# Patient Record
Sex: Male | Born: 1980
Health system: Southern US, Community
[De-identification: ages and names within clinical notes are randomized; demographics above are authoritative.]

## PROBLEM LIST (undated history)

## (undated) DIAGNOSIS — Z9289 Personal history of other medical treatment: Secondary | ICD-10-CM

---

## 2000-01-23 ENCOUNTER — Inpatient Hospital Stay (HOSPITAL_COMMUNITY): Admission: AD | Admit: 2000-01-23 | Discharge: 2000-01-27 | Payer: Self-pay | Admitting: *Deleted

## 2001-03-09 ENCOUNTER — Encounter: Admission: RE | Admit: 2001-03-09 | Discharge: 2001-03-09 | Payer: Self-pay | Admitting: *Deleted

## 2003-07-12 ENCOUNTER — Emergency Department (HOSPITAL_COMMUNITY): Admission: EM | Admit: 2003-07-12 | Discharge: 2003-07-12 | Payer: Self-pay | Admitting: Emergency Medicine

## 2018-05-30 DIAGNOSIS — Z0000 Encounter for general adult medical examination without abnormal findings: Secondary | ICD-10-CM | POA: Diagnosis not present

## 2018-06-06 DIAGNOSIS — M25569 Pain in unspecified knee: Secondary | ICD-10-CM | POA: Diagnosis not present

## 2018-06-06 DIAGNOSIS — Z0000 Encounter for general adult medical examination without abnormal findings: Secondary | ICD-10-CM | POA: Diagnosis not present

## 2018-08-02 DIAGNOSIS — M25531 Pain in right wrist: Secondary | ICD-10-CM | POA: Diagnosis not present

## 2018-08-02 DIAGNOSIS — G5601 Carpal tunnel syndrome, right upper limb: Secondary | ICD-10-CM | POA: Diagnosis not present

## 2018-08-16 DIAGNOSIS — M25531 Pain in right wrist: Secondary | ICD-10-CM | POA: Diagnosis not present

## 2018-08-16 DIAGNOSIS — G5601 Carpal tunnel syndrome, right upper limb: Secondary | ICD-10-CM | POA: Diagnosis not present

## 2018-09-03 DIAGNOSIS — M25531 Pain in right wrist: Secondary | ICD-10-CM | POA: Diagnosis not present

## 2018-09-04 ENCOUNTER — Other Ambulatory Visit: Payer: Self-pay | Admitting: Internal Medicine

## 2018-09-04 DIAGNOSIS — M79641 Pain in right hand: Secondary | ICD-10-CM

## 2018-09-04 DIAGNOSIS — M25531 Pain in right wrist: Secondary | ICD-10-CM

## 2018-10-11 DIAGNOSIS — M25531 Pain in right wrist: Secondary | ICD-10-CM | POA: Diagnosis not present

## 2018-10-11 DIAGNOSIS — S63591A Other specified sprain of right wrist, initial encounter: Secondary | ICD-10-CM | POA: Diagnosis not present

## 2019-01-02 DIAGNOSIS — Z20828 Contact with and (suspected) exposure to other viral communicable diseases: Secondary | ICD-10-CM | POA: Diagnosis not present

## 2019-02-14 DIAGNOSIS — F4322 Adjustment disorder with anxiety: Secondary | ICD-10-CM | POA: Diagnosis not present

## 2019-02-21 DIAGNOSIS — F4325 Adjustment disorder with mixed disturbance of emotions and conduct: Secondary | ICD-10-CM | POA: Diagnosis not present

## 2019-03-07 DIAGNOSIS — F4325 Adjustment disorder with mixed disturbance of emotions and conduct: Secondary | ICD-10-CM | POA: Diagnosis not present

## 2019-03-12 DIAGNOSIS — F4322 Adjustment disorder with anxiety: Secondary | ICD-10-CM | POA: Diagnosis not present

## 2019-03-21 DIAGNOSIS — F4322 Adjustment disorder with anxiety: Secondary | ICD-10-CM | POA: Diagnosis not present

## 2019-03-23 ENCOUNTER — Other Ambulatory Visit: Payer: Self-pay

## 2019-03-23 DIAGNOSIS — Z20822 Contact with and (suspected) exposure to covid-19: Secondary | ICD-10-CM

## 2019-03-25 ENCOUNTER — Telehealth: Payer: Self-pay

## 2019-03-25 LAB — NOVEL CORONAVIRUS, NAA: SARS-CoV-2, NAA: NOT DETECTED

## 2019-03-25 NOTE — Telephone Encounter (Signed)
Patient advise that result are not in yet

## 2019-04-02 DIAGNOSIS — F4322 Adjustment disorder with anxiety: Secondary | ICD-10-CM | POA: Diagnosis not present

## 2019-04-16 DIAGNOSIS — F4322 Adjustment disorder with anxiety: Secondary | ICD-10-CM | POA: Diagnosis not present

## 2019-07-04 ENCOUNTER — Ambulatory Visit: Payer: Self-pay | Attending: Internal Medicine

## 2019-07-04 DIAGNOSIS — Z23 Encounter for immunization: Secondary | ICD-10-CM | POA: Insufficient documentation

## 2019-07-04 NOTE — Progress Notes (Signed)
   Covid-19 Vaccination Clinic  Name:  OSMANY AZER    MRN: [id number] DOB: 1981/04/11  07/04/2019  Mr. Straka was observed post Covid-19 immunization for 15 minutes without incident. He was provided with Vaccine Information Sheet and instruction to access the V-Safe system.   Mr. Yannuzzi was instructed to call 911 with any severe reactions post vaccine: Marland Kitchen Difficulty breathing  . Swelling of face and throat  . A fast heartbeat  . A bad rash all over body  . Dizziness and weakness   Immunizations Administered    Name Date Dose VIS Date Route   Pfizer COVID-19 Vaccine 07/04/2019  5:55 PM 0.3 mL 04/12/2019 Intramuscular   Manufacturer: [employer], [employer]   Lot: [id number]   NDC: [address]-1

## 2019-07-31 ENCOUNTER — Ambulatory Visit: Payer: Self-pay | Attending: Internal Medicine

## 2019-07-31 DIAGNOSIS — Z23 Encounter for immunization: Secondary | ICD-10-CM

## 2019-07-31 NOTE — Progress Notes (Signed)
   Covid-19 Vaccination Clinic  Name:  ARJAY JASKIEWICZ    MRN: [id number] DOB: Oct 23, 1980  07/31/2019  Mr. Tanguma was observed post Covid-19 immunization for 15 minutes without incident. He was provided with Vaccine Information Sheet and instruction to access the V-Safe system.   Mr. Edelson was instructed to call 911 with any severe reactions post vaccine: Marland Kitchen Difficulty breathing  . Swelling of face and throat  . A fast heartbeat  . A bad rash all over body  . Dizziness and weakness   Immunizations Administered    Name Date Dose VIS Date Route   Pfizer COVID-19 Vaccine 07/31/2019  9:11 AM 0.3 mL 04/12/2019 Intramuscular   Manufacturer: [employer], [employer]   Lot: [id number]   NDC: [address]-5

## 2019-08-19 ENCOUNTER — Ambulatory Visit: Payer: 59 | Attending: Internal Medicine

## 2019-08-19 DIAGNOSIS — Z20822 Contact with and (suspected) exposure to covid-19: Secondary | ICD-10-CM

## 2019-08-20 LAB — SARS-COV-2, NAA 2 DAY TAT

## 2019-08-20 LAB — NOVEL CORONAVIRUS, NAA: SARS-CoV-2, NAA: NOT DETECTED

## 2020-02-14 ENCOUNTER — Other Ambulatory Visit: Payer: Self-pay | Admitting: Internal Medicine

## 2020-02-20 ENCOUNTER — Other Ambulatory Visit: Payer: Self-pay | Admitting: Internal Medicine

## 2020-02-20 DIAGNOSIS — K4090 Unilateral inguinal hernia, without obstruction or gangrene, not specified as recurrent: Secondary | ICD-10-CM

## 2020-03-04 ENCOUNTER — Ambulatory Visit
Admission: RE | Admit: 2020-03-04 | Discharge: 2020-03-04 | Disposition: A | Discharge status: Home / Self Care | Payer: 59 | Source: Ambulatory Visit | Attending: Internal Medicine | Admitting: Internal Medicine

## 2020-03-04 DIAGNOSIS — K4090 Unilateral inguinal hernia, without obstruction or gangrene, not specified as recurrent: Secondary | ICD-10-CM

## 2020-03-04 MED ORDER — IOPAMIDOL (ISOVUE-300) INJECTION 61%
100.0000 mL | Freq: Once | INTRAVENOUS | Status: AC | PRN
  Administered 2020-03-04: 100 mL via INTRAVENOUS

## 2020-08-03 ENCOUNTER — Other Ambulatory Visit (HOSPITAL_COMMUNITY)
Admission: RE | Admit: 2020-08-03 | Discharge: 2020-08-03 | Disposition: A | Discharge status: Home / Self Care | Payer: 59 | Source: Ambulatory Visit | Attending: Surgery | Admitting: Surgery

## 2020-08-03 DIAGNOSIS — Z20822 Contact with and (suspected) exposure to covid-19: Secondary | ICD-10-CM | POA: Diagnosis not present

## 2020-08-03 DIAGNOSIS — Z01812 Encounter for preprocedural laboratory examination: Secondary | ICD-10-CM | POA: Insufficient documentation

## 2020-08-03 LAB — SARS CORONAVIRUS 2 (TAT 6-24 HRS): SARS Coronavirus 2: NEGATIVE

## 2020-08-05 ENCOUNTER — Ambulatory Visit: Payer: Self-pay | Admitting: Surgery

## 2020-08-05 ENCOUNTER — Encounter (HOSPITAL_COMMUNITY): Payer: Self-pay | Admitting: Surgery

## 2020-08-05 NOTE — Progress Notes (Signed)
Patient denies shortness of breath, fever, cough or chest pain.  PCP - Dr Catalina Pizza Cardiologist - n/a  Chest x-ray - n/a EKG - n.a Stress Test - n/a ECHO - n/a Cardiac Cath - n/a  STOP now taking any Aspirin (unless otherwise instructed by your surgeon), Aleve, Naproxen, Ibuprofen, Motrin, Advil, Goody's, BC's, all herbal medications, fish oil, and all vitamins.   Coronavirus Screening Covid test on 08/03/20 was negative.  Patient verbalized understanding of instructions that were given via phone.

## 2020-08-06 ENCOUNTER — Encounter (HOSPITAL_COMMUNITY)
Admission: RE | Disposition: A | Discharge status: Home / Self Care | Payer: Self-pay | Source: Home / Self Care | Attending: Surgery

## 2020-08-06 ENCOUNTER — Ambulatory Visit (HOSPITAL_COMMUNITY): Payer: 59

## 2020-08-06 ENCOUNTER — Encounter (HOSPITAL_COMMUNITY): Payer: Self-pay | Admitting: Surgery

## 2020-08-06 ENCOUNTER — Ambulatory Visit (HOSPITAL_COMMUNITY)
Admission: RE | Admit: 2020-08-06 | Discharge: 2020-08-06 | Disposition: A | Discharge status: Home / Self Care | Payer: 59 | Attending: Surgery | Admitting: Surgery

## 2020-08-06 ENCOUNTER — Other Ambulatory Visit: Payer: Self-pay

## 2020-08-06 DIAGNOSIS — K4090 Unilateral inguinal hernia, without obstruction or gangrene, not specified as recurrent: Secondary | ICD-10-CM | POA: Insufficient documentation

## 2020-08-06 DIAGNOSIS — Z9049 Acquired absence of other specified parts of digestive tract: Secondary | ICD-10-CM | POA: Diagnosis not present

## 2020-08-06 DIAGNOSIS — Z87891 Personal history of nicotine dependence: Secondary | ICD-10-CM | POA: Diagnosis not present

## 2020-08-06 HISTORY — PX: INGUINAL HERNIA REPAIR: SHX194

## 2020-08-06 HISTORY — DX: Personal history of other medical treatment: Z92.89

## 2020-08-06 LAB — CBC
HCT: 42.1 % (ref 39.0–52.0)
Hemoglobin: 14.7 g/dL (ref 13.0–17.0)
MCH: 30.1 pg (ref 26.0–34.0)
MCHC: 34.9 g/dL (ref 30.0–36.0)
MCV: 86.3 fL (ref 80.0–100.0)
Platelets: 205 10*3/uL (ref 150–400)
RBC: 4.88 MIL/uL (ref 4.22–5.81)
RDW: 12.1 % (ref 11.5–15.5)
WBC: 6.3 10*3/uL (ref 4.0–10.5)
nRBC: 0 % (ref 0.0–0.2)

## 2020-08-06 SURGERY — REPAIR, HERNIA, INGUINAL, LAPAROSCOPIC
Anesthesia: General | Laterality: Right

## 2020-08-06 MED ORDER — KETOROLAC TROMETHAMINE 30 MG/ML IJ SOLN
INTRAMUSCULAR | Status: DC | PRN
  Administered 2020-08-06: 30 mg via INTRAVENOUS

## 2020-08-06 MED ORDER — PROPOFOL 10 MG/ML IV BOLUS
INTRAVENOUS | Status: AC
  Filled 2020-08-06: qty 40

## 2020-08-06 MED ORDER — LACTATED RINGERS IV SOLN: INTRAVENOUS | Status: DC

## 2020-08-06 MED ORDER — OXYCODONE HCL 5 MG PO TABS
ORAL_TABLET | ORAL | Status: AC
  Filled 2020-08-06: qty 1

## 2020-08-06 MED ORDER — PROPOFOL 10 MG/ML IV BOLUS
INTRAVENOUS | Status: DC | PRN
  Administered 2020-08-06: 200 mg via INTRAVENOUS

## 2020-08-06 MED ORDER — ONDANSETRON HCL 4 MG/2ML IJ SOLN
INTRAMUSCULAR | Status: DC | PRN
  Administered 2020-08-06: 4 mg via INTRAVENOUS

## 2020-08-06 MED ORDER — ACETAMINOPHEN 10 MG/ML IV SOLN
INTRAVENOUS | Status: AC
  Filled 2020-08-06: qty 100

## 2020-08-06 MED ORDER — ONDANSETRON HCL 4 MG/2ML IJ SOLN: 4.0000 mg | Freq: Once | INTRAMUSCULAR | Status: DC | PRN

## 2020-08-06 MED ORDER — SUGAMMADEX SODIUM 200 MG/2ML IV SOLN
INTRAVENOUS | Status: DC | PRN
  Administered 2020-08-06: 200 mg via INTRAVENOUS

## 2020-08-06 MED ORDER — SODIUM CHLORIDE 0.9 % IR SOLN
Status: DC | PRN
  Administered 2020-08-06: 1000 mL

## 2020-08-06 MED ORDER — MIDAZOLAM HCL 2 MG/2ML IJ SOLN
INTRAMUSCULAR | Status: AC
  Filled 2020-08-06: qty 2

## 2020-08-06 MED ORDER — ROCURONIUM BROMIDE 10 MG/ML (PF) SYRINGE
PREFILLED_SYRINGE | INTRAVENOUS | Status: DC | PRN
  Administered 2020-08-06: 60 mg via INTRAVENOUS

## 2020-08-06 MED ORDER — CEFAZOLIN SODIUM-DEXTROSE 2-4 GM/100ML-% IV SOLN
INTRAVENOUS | Status: AC
  Filled 2020-08-06: qty 100

## 2020-08-06 MED ORDER — KETOROLAC TROMETHAMINE 30 MG/ML IJ SOLN
INTRAMUSCULAR | Status: AC
  Filled 2020-08-06: qty 1

## 2020-08-06 MED ORDER — CEFAZOLIN SODIUM-DEXTROSE 2-3 GM-%(50ML) IV SOLR
INTRAVENOUS | Status: DC | PRN
  Administered 2020-08-06: 2 g via INTRAVENOUS

## 2020-08-06 MED ORDER — LIDOCAINE 2% (20 MG/ML) 5 ML SYRINGE
INTRAMUSCULAR | Status: DC | PRN
  Administered 2020-08-06: 40 mg via INTRAVENOUS

## 2020-08-06 MED ORDER — BUPIVACAINE HCL (PF) 0.25 % IJ SOLN
INTRAMUSCULAR | Status: DC | PRN
  Administered 2020-08-06: 10 mL

## 2020-08-06 MED ORDER — ONDANSETRON HCL 4 MG/2ML IJ SOLN
INTRAMUSCULAR | Status: AC
  Filled 2020-08-06: qty 2

## 2020-08-06 MED ORDER — DEXAMETHASONE SODIUM PHOSPHATE 10 MG/ML IJ SOLN
INTRAMUSCULAR | Status: AC
  Filled 2020-08-06: qty 1

## 2020-08-06 MED ORDER — BUPIVACAINE HCL (PF) 0.25 % IJ SOLN
INTRAMUSCULAR | Status: AC
  Filled 2020-08-06: qty 30

## 2020-08-06 MED ORDER — FENTANYL CITRATE (PF) 100 MCG/2ML IJ SOLN
25.0000 ug | INTRAMUSCULAR | Status: DC | PRN
  Administered 2020-08-06: 25 ug via INTRAVENOUS

## 2020-08-06 MED ORDER — DEXMEDETOMIDINE (PRECEDEX) IN NS 20 MCG/5ML (4 MCG/ML) IV SYRINGE
PREFILLED_SYRINGE | INTRAVENOUS | Status: DC | PRN
  Administered 2020-08-06 (×3): 8 ug via INTRAVENOUS

## 2020-08-06 MED ORDER — OXYCODONE HCL 5 MG PO TABS
5.0000 mg | ORAL_TABLET | Freq: Once | ORAL | Status: AC | PRN
  Administered 2020-08-06: 5 mg via ORAL

## 2020-08-06 MED ORDER — FENTANYL CITRATE (PF) 100 MCG/2ML IJ SOLN
INTRAMUSCULAR | Status: AC
  Filled 2020-08-06: qty 2

## 2020-08-06 MED ORDER — DEXMEDETOMIDINE (PRECEDEX) IN NS 20 MCG/5ML (4 MCG/ML) IV SYRINGE
PREFILLED_SYRINGE | INTRAVENOUS | Status: AC
  Filled 2020-08-06: qty 10

## 2020-08-06 MED ORDER — FENTANYL CITRATE (PF) 250 MCG/5ML IJ SOLN
INTRAMUSCULAR | Status: DC | PRN
  Administered 2020-08-06: 100 ug via INTRAVENOUS
  Administered 2020-08-06: 50 ug via INTRAVENOUS
  Administered 2020-08-06: 100 ug via INTRAVENOUS

## 2020-08-06 MED ORDER — FENTANYL CITRATE (PF) 250 MCG/5ML IJ SOLN
INTRAMUSCULAR | Status: AC
  Filled 2020-08-06: qty 5

## 2020-08-06 MED ORDER — MIDAZOLAM HCL 5 MG/5ML IJ SOLN
INTRAMUSCULAR | Status: DC | PRN
  Administered 2020-08-06: 2 mg via INTRAVENOUS

## 2020-08-06 MED ORDER — OXYCODONE HCL 5 MG PO TABS: 5.0000 mg | ORAL_TABLET | Freq: Four times a day (QID) | ORAL | 0 refills | Status: AC | PRN

## 2020-08-06 MED ORDER — ACETAMINOPHEN 10 MG/ML IV SOLN
INTRAVENOUS | Status: DC | PRN
  Administered 2020-08-06: 1000 mg via INTRAVENOUS

## 2020-08-06 MED ORDER — ORAL CARE MOUTH RINSE: 15.0000 mL | Freq: Once | OROMUCOSAL | Status: AC

## 2020-08-06 MED ORDER — CHLORHEXIDINE GLUCONATE 0.12 % MT SOLN
15.0000 mL | Freq: Once | OROMUCOSAL | Status: AC
  Administered 2020-08-06: 15 mL via OROMUCOSAL
  Filled 2020-08-06: qty 15

## 2020-08-06 MED ORDER — DEXAMETHASONE SODIUM PHOSPHATE 10 MG/ML IJ SOLN
INTRAMUSCULAR | Status: DC | PRN
  Administered 2020-08-06: 4 mg via INTRAVENOUS

## 2020-08-06 MED ORDER — OXYCODONE HCL 5 MG/5ML PO SOLN
5.0000 mg | Freq: Once | ORAL | Status: AC | PRN
Start: 2020-08-06 — End: 2020-08-06

## 2020-08-06 SURGICAL SUPPLY — 44 items
ADH SKN CLS APL DERMABOND .7 (GAUZE/BANDAGES/DRESSINGS) ×1
ADH SKN CLS LQ APL DERMABOND (GAUZE/BANDAGES/DRESSINGS) ×1
APL PRP STRL LF DISP 70% ISPRP (MISCELLANEOUS) ×1
APPLIER CLIP LOGIC TI 5 (MISCELLANEOUS) IMPLANT
APR CLP MED LRG 33X5 (MISCELLANEOUS)
BLADE CLIPPER SURG (BLADE) IMPLANT
CANISTER SUCT 3000ML PPV (MISCELLANEOUS) IMPLANT
CHLORAPREP W/TINT 26 (MISCELLANEOUS) ×2 IMPLANT
COVER SURGICAL LIGHT HANDLE (MISCELLANEOUS) ×2 IMPLANT
COVER WAND RF STERILE (DRAPES) ×2 IMPLANT
DERMABOND ADHESIVE PROPEN (GAUZE/BANDAGES/DRESSINGS) ×1
DERMABOND ADVANCED (GAUZE/BANDAGES/DRESSINGS) ×1
DERMABOND ADVANCED .7 DNX12 (GAUZE/BANDAGES/DRESSINGS) ×1 IMPLANT
DERMABOND ADVANCED .7 DNX6 (GAUZE/BANDAGES/DRESSINGS) ×1 IMPLANT
DEVICE SECURE STRAP 25 ABSORB (INSTRUMENTS) ×2 IMPLANT
DISSECT BALLN SPACEMKR + OVL (BALLOONS)
DISSECTOR BALLN SPACEMKR + OVL (BALLOONS) IMPLANT
DISSECTOR BLUNT TIP ENDO 5MM (MISCELLANEOUS) ×2 IMPLANT
DRAPE LAPAROSCOPIC ABDOMINAL (DRAPES) ×2 IMPLANT
ELECT REM PT RETURN 9FT ADLT (ELECTROSURGICAL) ×2
ELECTRODE REM PT RTRN 9FT ADLT (ELECTROSURGICAL) ×1 IMPLANT
GLOVE SURG SIGNA 7.5 PF LTX (GLOVE) ×2 IMPLANT
GOWN STRL REUS W/ TWL LRG LVL3 (GOWN DISPOSABLE) ×4 IMPLANT
GOWN STRL REUS W/ TWL XL LVL3 (GOWN DISPOSABLE) IMPLANT
GOWN STRL REUS W/TWL LRG LVL3 (GOWN DISPOSABLE) ×8
GOWN STRL REUS W/TWL XL LVL3 (GOWN DISPOSABLE)
KIT BASIN OR (CUSTOM PROCEDURE TRAY) ×2 IMPLANT
KIT TURNOVER KIT B (KITS) ×2 IMPLANT
MESH 3DMAX 4X6 RT LRG (Mesh General) ×2 IMPLANT
NS IRRIG 1000ML POUR BTL (IV SOLUTION) ×2 IMPLANT
PAD ARMBOARD 7.5X6 YLW CONV (MISCELLANEOUS) ×2 IMPLANT
SCISSORS LAP 5X35 DISP (ENDOMECHANICALS) ×2 IMPLANT
SET IRRIG TUBING LAPAROSCOPIC (IRRIGATION / IRRIGATOR) ×2 IMPLANT
SET TROCAR LAP APPLE-HUNT 5MM (ENDOMECHANICALS) IMPLANT
SET TUBE SMOKE EVAC HIGH FLOW (TUBING) IMPLANT
SLEEVE ENDOPATH XCEL 5M (ENDOMECHANICALS) ×2 IMPLANT
SUT MNCRL AB 4-0 PS2 18 (SUTURE) ×2 IMPLANT
TOWEL GREEN STERILE (TOWEL DISPOSABLE) ×2 IMPLANT
TOWEL GREEN STERILE FF (TOWEL DISPOSABLE) ×2 IMPLANT
TRAY FOLEY W/BAG SLVR 16FR (SET/KITS/TRAYS/PACK) ×2
TRAY FOLEY W/BAG SLVR 16FR ST (SET/KITS/TRAYS/PACK) ×1 IMPLANT
TRAY LAPAROSCOPIC MC (CUSTOM PROCEDURE TRAY) ×2 IMPLANT
TROCAR XCEL BLUNT TIP 100MML (ENDOMECHANICALS) ×2 IMPLANT
WATER STERILE IRR 1000ML POUR (IV SOLUTION) ×2 IMPLANT

## 2020-08-06 NOTE — Anesthesia Postprocedure Evaluation (Signed)
Anesthesia Post Note  Patient: Troy Park  Procedure(s) Performed: LAPAROSCOPIC RIGHT INGUINAL HERNIA REPAIR (Right )     Patient location during evaluation: PACU Anesthesia Type: General Level of consciousness: awake and alert Pain management: pain level controlled Vital Signs Assessment: post-procedure vital signs reviewed and stable Respiratory status: spontaneous breathing, nonlabored ventilation, respiratory function stable and patient connected to nasal cannula oxygen Cardiovascular status: blood pressure returned to baseline and stable Postop Assessment: no apparent nausea or vomiting Anesthetic complications: no   No complications documented.  Last Vitals:  Vitals:   08/06/20 0952 08/06/20 0956  BP: 111/72 118/78  Pulse: (!) 46 (!) 52  Resp: 12 13  Temp:  36.7 C  SpO2: 95% 98%    Last Pain:  Vitals:   08/06/20 0937  TempSrc:   PainSc: 10-Worst pain ever                 [name]

## 2020-08-06 NOTE — Anesthesia Preprocedure Evaluation (Signed)
Anesthesia Evaluation  Patient identified by MRN, date of birth, ID band Patient awake    Reviewed: Allergy & Precautions, NPO status , Patient's Chart, lab work & pertinent test results  Airway Mallampati: II  TM Distance: >3 FB Neck ROM: Full    Dental  (+) Teeth Intact, Dental Advisory Given   Pulmonary former smoker,    breath sounds clear to auscultation       Cardiovascular  Rhythm:Regular Rate:Normal     Neuro/Psych    GI/Hepatic   Endo/Other    Renal/GU      Musculoskeletal   Abdominal   Peds  Hematology   Anesthesia Other Findings   Reproductive/Obstetrics                             Anesthesia Physical Anesthesia Plan  ASA: I  Anesthesia Plan: General   Post-op Pain Management:    Induction: Intravenous  PONV Risk Score and Plan: Ondansetron and Dexamethasone  Airway Management Planned: Oral ETT  Additional Equipment:   Intra-op Plan:   Post-operative Plan: Extubation in OR  Informed Consent: I have reviewed the patients History and Physical, chart, labs and discussed the procedure including the risks, benefits and alternatives for the proposed anesthesia with the patient or authorized representative who has indicated his/her understanding and acceptance.     Dental advisory given  Plan Discussed with: CRNA and Anesthesiologist  Anesthesia Plan Comments:         Anesthesia Quick Evaluation

## 2020-08-06 NOTE — Op Note (Signed)
Date: 08/06/20  Patient: Troy Park MRN: [id number]  Preoperative Diagnosis: Right inguinal hernia Postoperative Diagnosis: Indirect right inguinal hernia  Procedure: Laparoscopic right inguinal hernia repair  Surgeon: Sophronia Simas, MD Assistant: Marca Ancona, MD  EBL: Minimal  Anesthesia: General  Specimens: None  Indications: Troy Park is a 40 yo male who presented with a bulge in the right groin consistent with inguinal hernia. He had symptoms daily and opted to proceed with repair after a discussion of the risks and benefits of surgery.  Findings: Indirect right inguinal hernia, repaired with a large Bard 3DMax mesh. No evidence of left inguinal hernia.  Procedure details: Informed consent was obtained in the preoperative area prior to the procedure. The patient was brought to the operating room and placed on the table in the supine position. General anesthesia was induced and a Foley catheter was placed. Perioperative antibiotics were administered per SCIP guidelines. The abdomen was prepped and draped in the usual sterile fashion. A pre-procedure timeout was taken verifying patient identity, surgical site and procedure to be performed.  An infraumbilical skin incision was made, the subcutaneous tissue was bluntly spread, and the umbilical stalk was grasped and elevated. The fascia was incised, the peritoneal cavity was visualized, and a Hassan trocar was inserted. The abdomen was insufflated and the peritoneal cavity was inspected, with no evidence of visceral or vascular injury. 85mm ports were then placed in the right mid-abdomen and left mid-abdomen under direct visualization. The peritoneal cavity was inspected with no evidence of visceral or vascular injury. There was an indirect right inguinal hernia, but no evidence of left inguinal hernia. The peritoneum was incised medially several centimeters above the pubic tubercle at the median umbilical ligament using cautery, and  this opening was carried laterally using cautery. A peritoneal flap was then created by using gentle blunt dissection to separate the peritoneum from the transversalis fascia, starting medially at the pubic tubercle and moving laterally. The flap was dissected inferiorly to the iliopubic tract, taking care to avoid injury to the inferior epigastric vessels. The hernia sac was bluntly dissected off the cord structures, and the vas deferens was visualized and protected. There was also a lipoma that was bluntly dissected off the cord structures. Once the sac and lipoma were freed, a large piece of Bard 3DMax right inguinal mesh was brought onto the field and inserted through the 37mm port. The mesh was unrolled and placed to cover the direct hernia defect and femoral spaces. The mesh was anchored medially to the pubic tubercle using absorbable tacks. The superior border of the mesh was tacked to the abdominal wall at two points. The lateral and inferior aspects of the mesh were laid flat and fit into the space easily - no tacks were placed in these areas. The peritoneum was then closed over the mesh using tacks. The fat on the medial umbilical ligament was pulled lateral to help cover the mesh and was tacked to the peritoneum. The peritoneal cavity was inspected and appeared hemostatic with no signs of injury to the bowel or bladder. The ports were removed and the abdomen was desufflated. The umbilical port site fascia was closed with 0 Vicryl suture. The skin at each port site was closed using 4-0 monocryl subcuticular suture. Dermabond was applied.  The patient tolerated the procedure well with no apparent complications. All counts were correct x2 at the end of the procedure. The patient was extubated and taken to PACU in stable condition.  Sophronia Simas, MD 08/06/20  8:59 AM

## 2020-08-06 NOTE — Anesthesia Procedure Notes (Signed)
Procedure Name: Intubation Date/Time: 08/06/2020 7:36 AM Performed by: Adria Dill, CRNA Pre-anesthesia Checklist: Patient identified, Emergency Drugs available, Suction available and Patient being monitored Patient Re-evaluated:Patient Re-evaluated prior to induction Oxygen Delivery Method: Circle system utilized Preoxygenation: Pre-oxygenation with 100% oxygen Induction Type: IV induction Ventilation: Mask ventilation without difficulty and Oral airway inserted - appropriate to patient size Laryngoscope Size: Miller and 3 Grade View: Grade II Tube type: Oral Tube size: 7.5 mm Number of attempts: 1 Airway Equipment and Method: Stylet and Oral airway Placement Confirmation: ETT inserted through vocal cords under direct vision,  positive ETCO2 and breath sounds checked- equal and bilateral Secured at: 21 cm Tube secured with: Tape Dental Injury: Teeth and Oropharynx as per pre-operative assessment

## 2020-08-06 NOTE — Discharge Instructions (Signed)
[facility] DISCHARGE INSTRUCTIONS  Activity . No heavy lifting greater than 10 pounds for 6 weeks after surgery. Marland Kitchen Ok to shower in 24 hours after surgery, but do not bathe or submerge incisions underwater. . Do not drive while taking narcotic pain medication.  Wound Care . Your incisions are covered with skin glue called Dermabond. This will peel off on its own over time. . You may shower 24 hours after surgery and allow warm soapy water to run over your incisions. Gently pat dry. . Do not submerge your incision underwater. . Monitor your incision for any new redness, tenderness, or drainage.  When to Call us: Marland Kitchen Fever greater than 100.5 . New redness, drainage, or swelling at incision site . Severe pain, nausea, or vomiting . Jaundice (yellowing of the whites of the eyes or skin)  Follow-up You have an appointment scheduled with Dr. Freida Busman on August 25, 2020 at 9:30am. This will be at the [facility] office at [address], [place], [place]. Please arrive at least 15 minutes prior to your scheduled appointment time.  For questions or concerns, please call the office at [phone].

## 2020-08-06 NOTE — H&P (Signed)
Troy Park is an 40 y.o. male.   Chief Complaint: inguinal hernia HPI: Troy Park is a 40 yo male male who presented with a bulge in the right groin. He notices it daily and would like it repaired. He presents today for surgery. No changes since initial clinic visit a few months ago. COVID negative on 4/4.  Past Medical History:  Diagnosis Date  . History of blood transfusion    at age 53 yrs old with broken arm surgery    Past Surgical History:  Procedure Laterality Date  . APPENDECTOMY    . arm surgery Left    x 2 surg  . COLONOSCOPY    . HERNIA REPAIR    . WISDOM TOOTH EXTRACTION      History reviewed. No pertinent family history. Social History:  reports that he quit smoking about 5 years ago. His smoking use included cigarettes. He has a 7.50 pack-year smoking history. He has never used smokeless tobacco. He reports that he does not drink alcohol and does not use drugs.  Allergies: No Known Allergies  Medications Prior to Admission  Medication Sig Dispense Refill  . ibuprofen (ADVIL) 200 MG tablet Take 400 mg by mouth daily as needed (pain).      No results found for this or any previous visit (from the past 48 hour(s)). No results found.  Review of Systems  Constitutional: Negative for fever.  Respiratory: Negative for shortness of breath.   Gastrointestinal: Negative for abdominal pain.  Allergic/Immunologic: Negative for immunocompromised state.  Neurological: Negative for speech difficulty.  Psychiatric/Behavioral: Negative for agitation and confusion.    Blood pressure 135/85, pulse (!) 54, temperature 98.2 F (36.8 C), temperature source Oral, resp. rate 18, height 6' (1.829 m), weight 91.6 kg, SpO2 99 %. Physical Exam Constitutional:      Appearance: Normal appearance.  HENT:     Head: Normocephalic and atraumatic.  Eyes:     General: No scleral icterus. Pulmonary:     Effort: Pulmonary effort is normal. No respiratory distress.  Abdominal:      General: There is no distension.     Palpations: Abdomen is soft.     Tenderness: There is no abdominal tenderness.  Musculoskeletal:        General: Normal range of motion.     Cervical back: Normal range of motion.  Skin:    General: Skin is warm and dry.     Coloration: Skin is not jaundiced.  Neurological:     General: No focal deficit present.     Mental Status: He is alert and oriented to person, place, and time.  Psychiatric:        Mood and Affect: Mood normal.        Behavior: Behavior normal.        Thought Content: Thought content normal.      Assessment/Plan 40 yo male with small right inguinal hernia. Proceed to OR with laparoscopic right inguinal hernia repair. Procedure details discussed including mesh placement. Informed consent obtained. Plan for discharge home from PACU.  Fritzi Mandes, MD 08/06/2020, 7:18 AM

## 2020-08-06 NOTE — Transfer of Care (Signed)
Immediate Anesthesia Transfer of Care Note  Patient: Troy Park  Procedure(s) Performed: LAPAROSCOPIC RIGHT INGUINAL HERNIA REPAIR (Right )  Patient Location: PACU  Anesthesia Type:General  Level of Consciousness: drowsy and patient cooperative  Airway & Oxygen Therapy: Patient Spontanous Breathing and Patient connected to face mask oxygen  Post-op Assessment: Report given to RN and Post -op Vital signs reviewed and stable  Post vital signs: Reviewed and stable  Last Vitals:  Vitals Value Taken Time  BP 139/95 08/06/20 0907  Temp    Pulse 71 08/06/20 0910  Resp 17 08/06/20 0910  SpO2 100 % 08/06/20 0910  Vitals shown include unvalidated device data.  Last Pain:  Vitals:   08/06/20 0650  TempSrc:   PainSc: 0-No pain         Complications: No complications documented.

## 2020-08-07 ENCOUNTER — Encounter (HOSPITAL_COMMUNITY): Payer: Self-pay | Admitting: Surgery

## 2020-08-17 ENCOUNTER — Encounter (HOSPITAL_COMMUNITY): Payer: Self-pay | Admitting: Surgery

## 2021-06-09 IMAGING — CT CT ABD-PELV W/ CM
2 of 4 series · 12 of 46 positions shown, 14 images · IV contrast (iopamidol)
Comparison: None.

CLINICAL DATA: Eval for inguinal hernia marked with vit e - one
month Hx of appy, hernia repair left inguinal repair Cancer-none No
pre

EXAM:
CT ABDOMEN AND PELVIS WITH CONTRAST
TECHNIQUE: Multidetector CT imaging of the abdomen and pelvis was performed
using the standard protocol following bolus administration of
intravenous contrast.
CONTRAST:  100mL 2DZB0M-MSS IOPAMIDOL (2DZB0M-MSS) INJECTION 61%

[Series 2: abd pelvis 5.00 br40 s3 axial · axial · 0.58mm/px · z∈[+1292,+1692]mm · 9 of 96 slices shown, 11 images]
[im 8/96  soft-tissue]
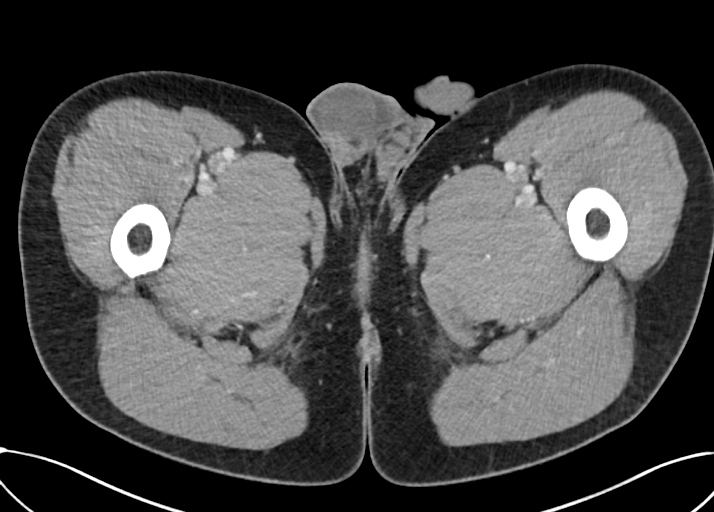
[im 8/96  bone]
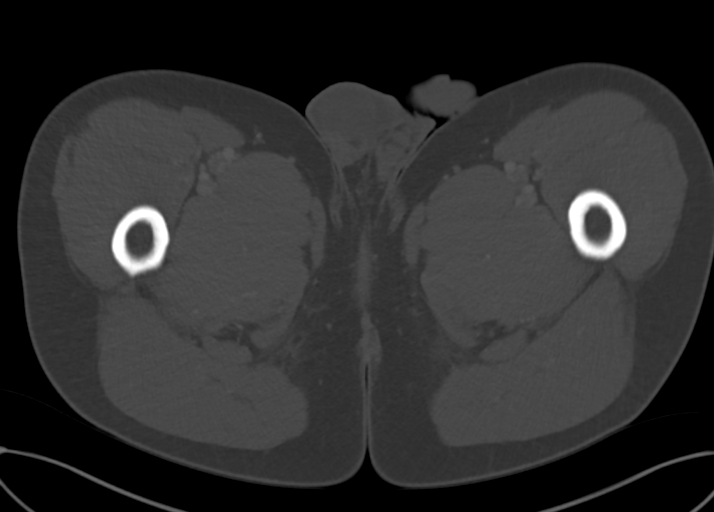
[im 20/96  soft-tissue]
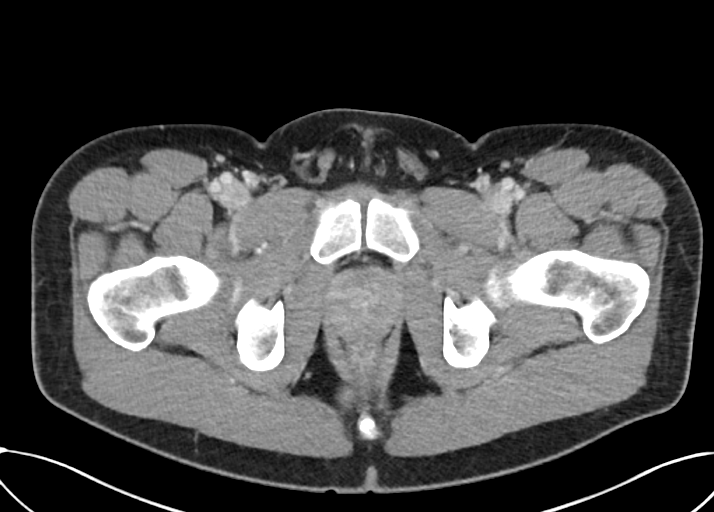
[im 27/96  soft-tissue]
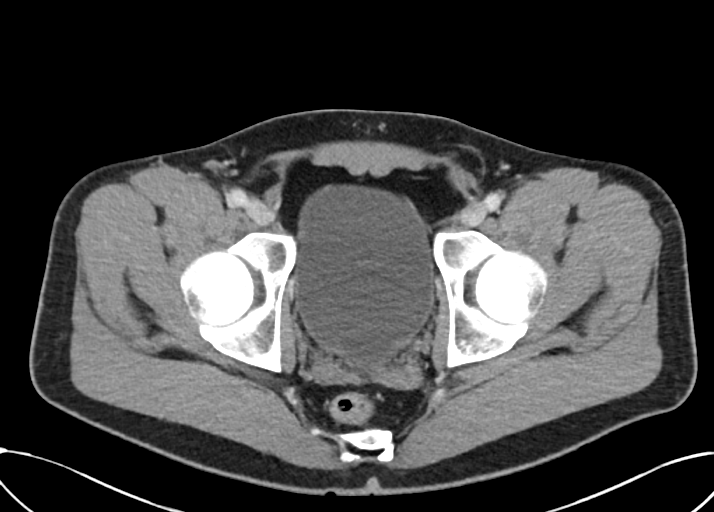
[im 39/96  soft-tissue]
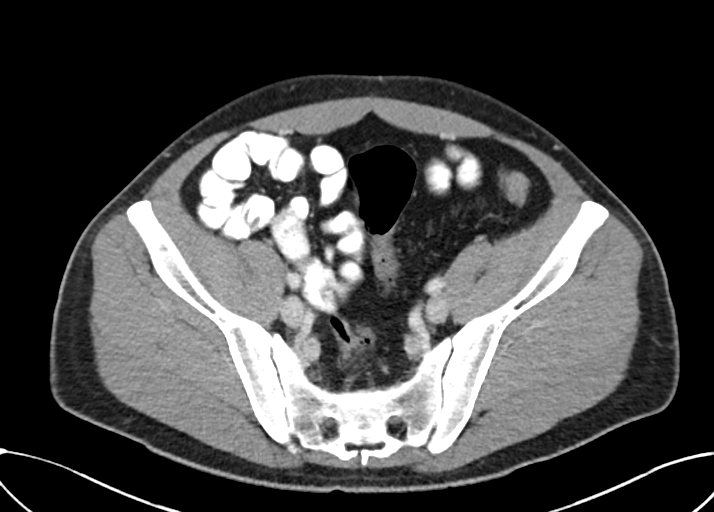
[im 50/96  soft-tissue]
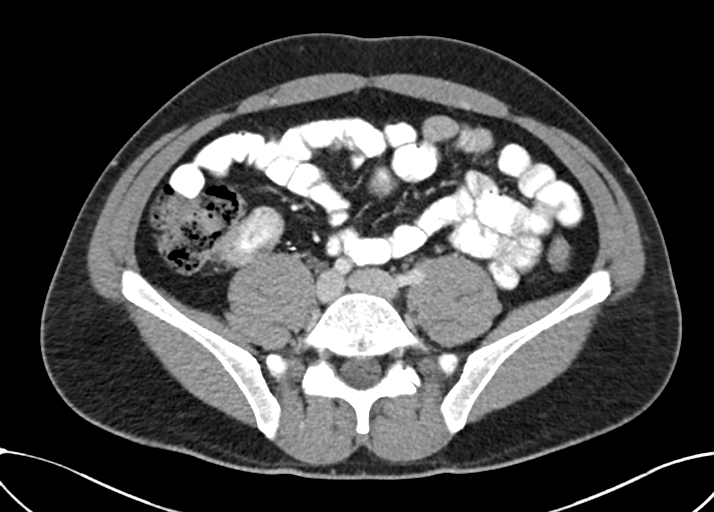
[im 58/96  soft-tissue]
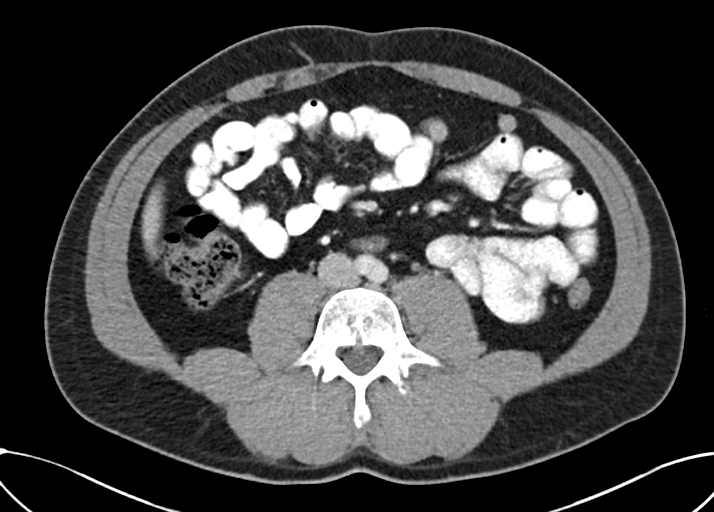
[im 69/96  soft-tissue]
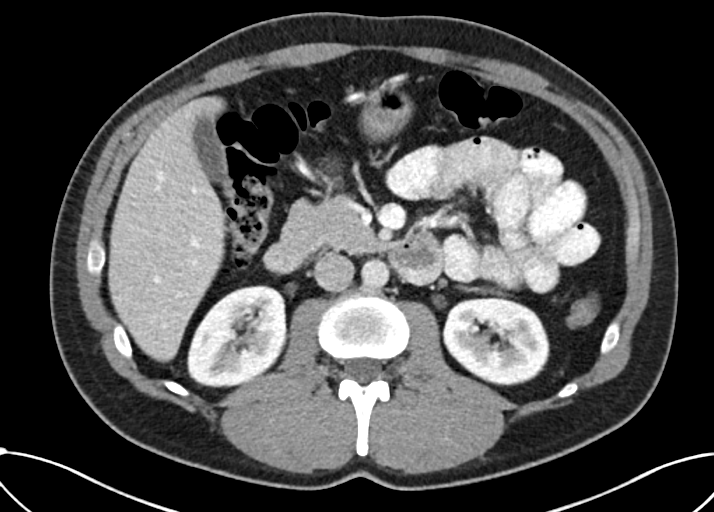
[im 80/96  soft-tissue]
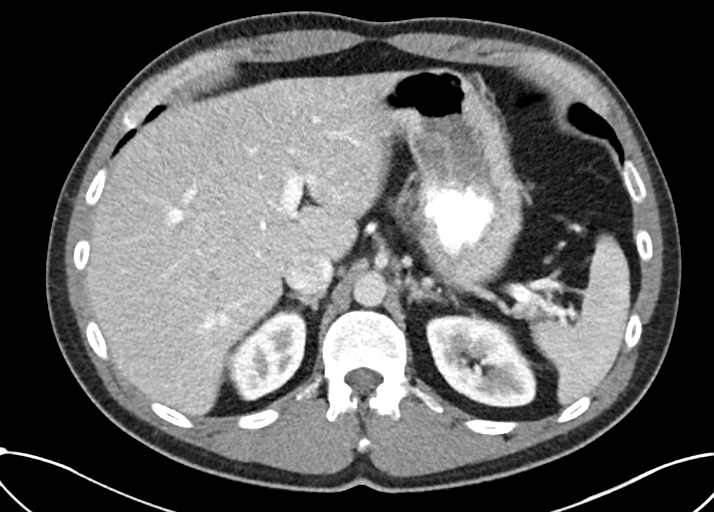
[im 88/96  soft-tissue]
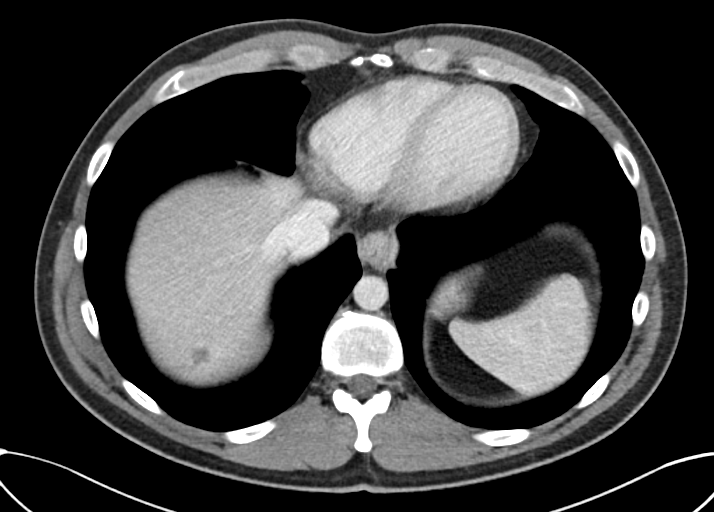
[im 88/96  bone]
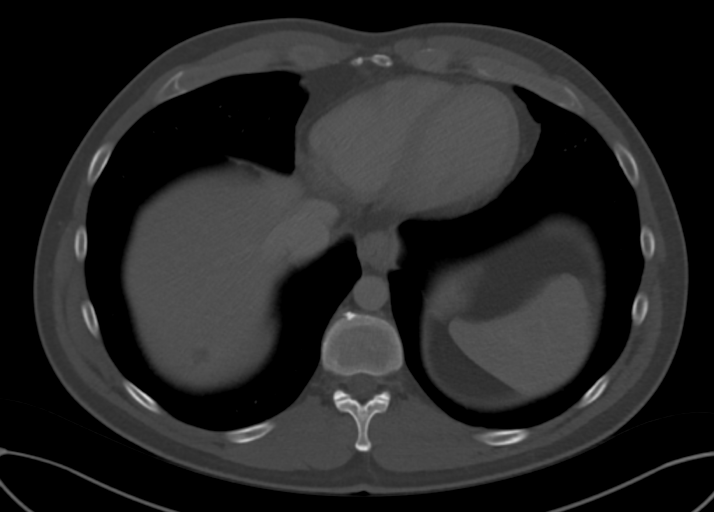

[Series 6: abd pelvis 2.00 br40 s3 cor · coronal · 0.79mm/px · 3 of 141 slices shown]
[im 47/141  soft-tissue]
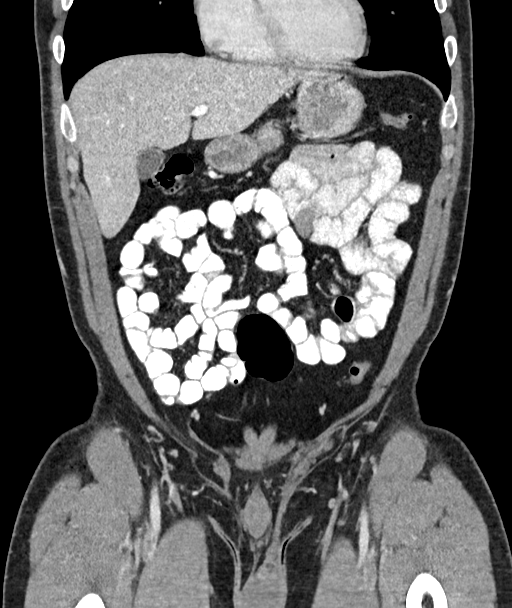
[im 63/141  soft-tissue]
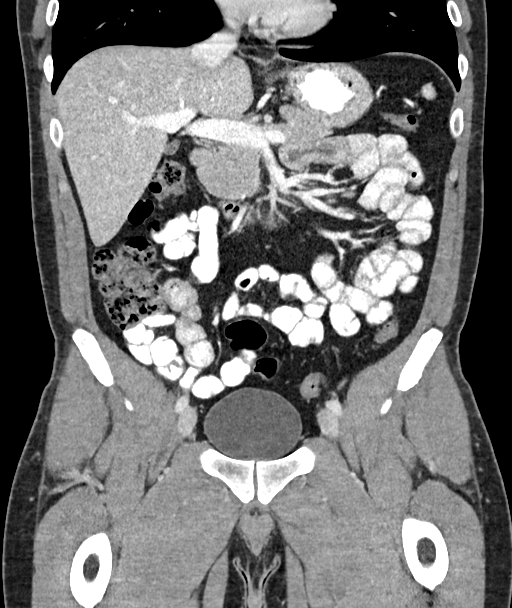
[im 78/141  soft-tissue]
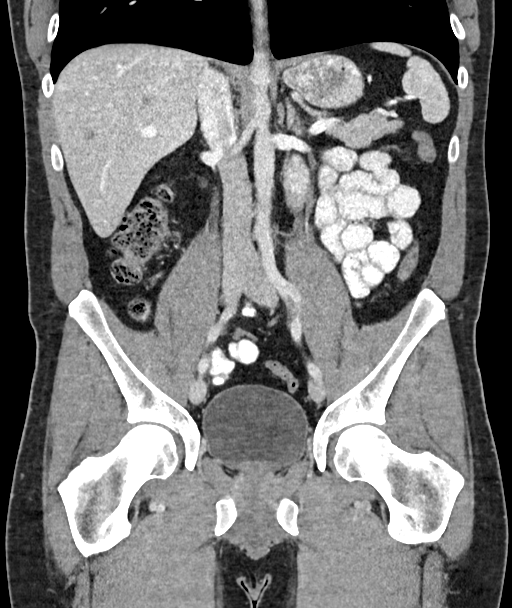

[12 of 46 positions shown; findings below may reference images not displayed]

FINDINGS: Lower chest: No acute abnormality.

Hepatobiliary: Subcentimeter hypodensity within the right hepatic
lobe is too small to characterize. No gallstones, gallbladder wall
thickening, or pericholecystic fluid. No biliary dilatation.

Pancreas: No focal lesion. Normal pancreatic contour. No surrounding
inflammatory changes. No main pancreatic ductal dilatation.

Spleen: Normal in size without focal abnormality.

Adrenals/Urinary Tract: No adrenal nodule bilaterally. Bilateral
kidneys enhance symmetrically. No hydronephrosis. No hydroureter.
The urinary bladder is unremarkable.

Stomach/Bowel: PO contrast reaches the terminal ileum. Stomach is
within normal limits. Suggestion of mild circumferential bowel wall
thickening of a short segment of terminal ileum likely due to under
distension with no adjacent surrounding fat stranding or
inflammatory changes. No small bowel dilatation. No evidence of
large bowel wall thickening or dilatation. No pneumatosis. Post
appendectomy.

Vascular/Lymphatic: No significant vascular findings are present. No
enlarged abdominal or pelvic lymph nodes.

Reproductive: Prostate is unremarkable.

Other: No intraperitoneal free fluid. No intraperitoneal free gas.
No organized fluid collection.

Musculoskeletal:

Soft tissue density within the left inguinal canal consistent with
known left inguinal hernia repair. No abdominal wall hernia or
abnormality

No suspicious lytic or blastic osseous lesions. No acute displaced
fracture. Mild degenerative changes of the spine.
IMPRESSION: 1. Status post left inguinal hernia repair with no radiographic
findings to suggest recurrence.
2. Suggestion of mild circumferential bowel wall thickening of a
short segment of terminal ileum likely due to under distension with
no adjacent surrounding fat stranding or inflammatory changes.
Recommend clinical correlation with signs/symptoms of ileitis.
